# Patient Record
Sex: Male | Born: 1985 | Race: Black or African American | Hispanic: No | Marital: Single | Smoking: Current every day smoker
Health system: Southern US, Community
[De-identification: ages and names within clinical notes are randomized; demographics above are authoritative.]

---
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
## 2008-01-13 ENCOUNTER — Emergency Department (HOSPITAL_COMMUNITY): Admission: EM | Admit: 2008-01-13 | Discharge: 2008-01-13 | Payer: Self-pay | Admitting: Family Medicine

## 2011-02-11 LAB — CULTURE, ROUTINE-ABSCESS

## 2017-07-31 ENCOUNTER — Emergency Department (HOSPITAL_COMMUNITY)
Admission: EM | Admit: 2017-07-31 | Discharge: 2017-08-01 | Disposition: A | Discharge status: Home / Self Care | Payer: Self-pay | Attending: Emergency Medicine | Admitting: Emergency Medicine

## 2017-07-31 ENCOUNTER — Other Ambulatory Visit: Payer: Self-pay

## 2017-07-31 ENCOUNTER — Encounter (HOSPITAL_COMMUNITY): Payer: Self-pay | Admitting: Emergency Medicine

## 2017-07-31 DIAGNOSIS — J069 Acute upper respiratory infection, unspecified: Secondary | ICD-10-CM | POA: Insufficient documentation

## 2017-07-31 DIAGNOSIS — F17210 Nicotine dependence, cigarettes, uncomplicated: Secondary | ICD-10-CM | POA: Insufficient documentation

## 2017-07-31 NOTE — ED Triage Notes (Signed)
Pt c/o generalized body ache 8/10, chills and fatigue started today.

## 2017-08-01 LAB — URINALYSIS, ROUTINE W REFLEX MICROSCOPIC
Bilirubin Urine: NEGATIVE
Glucose, UA: NEGATIVE mg/dL
Hgb urine dipstick: NEGATIVE
KETONES UR: NEGATIVE mg/dL
LEUKOCYTES UA: NEGATIVE
NITRITE: NEGATIVE
PROTEIN: NEGATIVE mg/dL
Specific Gravity, Urine: 1.021 (ref 1.005–1.030)
pH: 6 (ref 5.0–8.0)

## 2017-08-01 MED ORDER — FLUTICASONE PROPIONATE 50 MCG/ACT NA SUSP: 1.0000 | Freq: Every day | NASAL | 0 refills | Status: AC

## 2017-08-01 MED ORDER — PROMETHAZINE-DM 6.25-15 MG/5ML PO SYRP: 5.0000 mL | ORAL_SOLUTION | Freq: Four times a day (QID) | ORAL | 0 refills | Status: AC | PRN

## 2017-08-01 NOTE — ED Provider Notes (Signed)
[name] EMERGENCY DEPARTMENT Provider Note   CSN: [id number] Arrival date & time: 07/31/17  2244     History   Chief Complaint Chief Complaint  Patient presents with  . URI    HPI Cory Mason is a 32 y.o. male presenting for evaluation of cough, nasal congestion, and body aches.  Patient states he has had 1 day history of URI symptoms.  His significant other and daughter have similar symptoms that began in the past several days.  He reports generalized body aches and nasal congestion.  He has a nonproductive cough.  He reports chills without fever.  He is taking Mucinex and Tylenol without significant improvement of his symptoms.  He denies ear pain, eye pain, sore throat, chest pain, shortness of breath, nausea, vomiting, abdominal pain, urinary symptoms, normal bowel movements.  He has tolerated p.o. without difficulty today.  He states he had the flu earlier this year, and this feels similar.  He has no history of COPD or asthma.  He smokes about 4 cigarettes a day.  No other medical problems, does not take medications daily.  HPI  History reviewed. No pertinent past medical history.  There are no active problems to display for this patient.   History reviewed. No pertinent surgical history.      Home Medications    Prior to Admission medications   Medication Sig Start Date End Date Taking? Authorizing Provider  fluticasone (FLONASE) 50 MCG/ACT nasal spray Place 1 spray into both nostrils daily. 08/01/17   Elysa Womac, PA-C  promethazine-dextromethorphan (PROMETHAZINE-DM) 6.25-15 MG/5ML syrup Take 5 mLs by mouth 4 (four) times daily as needed. 08/01/17   Marishka Rentfrow, PA-C    Family History History reviewed. No pertinent family history.  Social History Social History   Tobacco Use  . Smoking status: Current Every Day Smoker    Packs/day: 0.50    Types: Cigarettes  . Smokeless tobacco: Never Used  Substance Use Topics  . Alcohol  use: Not Currently  . Drug use: Yes    Types: Marijuana     Allergies   Patient has no known allergies.   Review of Systems Review of Systems  Constitutional: Positive for chills.  HENT: Positive for congestion. Negative for sore throat.   Respiratory: Positive for cough. Negative for shortness of breath.   Cardiovascular: Negative for chest pain.     Physical Exam Updated Vital Signs BP 119/76   Pulse 90   Temp 98.4 F (36.9 C) (Oral)   Resp 16   Ht 6\' 2"  (1.88 m)   Wt 74.8 kg (165 lb)   SpO2 99%   BMI 21.18 kg/m   Physical Exam  Constitutional: He is oriented to person, place, and time. He appears well-developed and well-nourished. No distress.  HENT:  Head: Normocephalic and atraumatic.  Right Ear: Tympanic membrane, external ear and ear canal normal.  Left Ear: Tympanic membrane, external ear and ear canal normal.  Nose: Mucosal edema present. Right sinus exhibits no maxillary sinus tenderness and no frontal sinus tenderness. Left sinus exhibits no maxillary sinus tenderness and no frontal sinus tenderness.  Mouth/Throat: Uvula is midline, oropharynx is clear and moist and mucous membranes are normal. No tonsillar exudate.  Nasal mucosal edema.  Tenderness to palpation of maxillary sinuses.  OP clear without tonsillar swelling or exudate.  Uvula midline medical palate rise.  TMs not erythematous not bulging bilaterally.  Eyes: Pupils are equal, round, and reactive to light. Conjunctivae and EOM are  normal.  Neck: Normal range of motion.  Cardiovascular: Normal rate, regular rhythm and intact distal pulses.  Pulmonary/Chest: Effort normal and breath sounds normal. He has no decreased breath sounds. He has no wheezes. He has no rhonchi. He has no rales.  Pt speaking in full sentences without difficulty.  Clear lung sounds in all fields  Abdominal: Soft. He exhibits no distension. There is no tenderness.  Musculoskeletal: Normal range of motion.  Lymphadenopathy:     He has cervical adenopathy.  Neurological: He is alert and oriented to person, place, and time.  Skin: Skin is warm.  Psychiatric: He has a normal mood and affect.  Nursing note and vitals reviewed.    ED Treatments / Results  Labs (all labs ordered are listed, but only abnormal results are displayed) Labs Reviewed  URINALYSIS, ROUTINE W REFLEX MICROSCOPIC    EKG None  Radiology No results found.  Procedures Procedures (including critical care time)  Medications Ordered in ED Medications - No data to display   Initial Impression / Assessment and Plan / ED Course  I have reviewed the triage vital signs and the nursing notes.  Pertinent labs & imaging results that were available during my care of the patient were reviewed by me and considered in my medical decision making (see chart for details).     Patient presenting with 1 day h/o URI symptoms.  Physical exam reassuring, patient is afebrile and appears nontoxic.  Pulmonary exam reassuring.  Doubt pneumonia, strep, other bacterial infection, or peritonsillar abscess. Likely viral URI.  Will treat symptomatically.  Patient to follow-up  as needed.  At this time, patient appears safe for discharge.  Return precautions given.  Patient states he understands and agrees to plan.   Final Clinical Impressions(s) / ED Diagnoses   Final diagnoses:  Upper respiratory tract infection, unspecified type    ED Discharge Orders        Ordered    fluticasone (FLONASE) 50 MCG/ACT nasal spray  Daily     08/01/17 0006    promethazine-dextromethorphan (PROMETHAZINE-DM) 6.25-15 MG/5ML syrup  4 times daily PRN     08/01/17 0006       Alveria ApleyCaccavale, Woodrow Drab, PA-C 08/01/17 0020    Gilda CreasePollina, Christopher J, MD 08/01/17 [phone]

## 2017-08-01 NOTE — Discharge Instructions (Addendum)
You likely have a viral illness.  This should be treated symptomatically. °Use Tylenol or ibuprofen as needed for fevers or body aches. °Use Flonase daily for nasal congestion and cough. °Use cough syrup as needed.  °Make sure you stay well-hydrated with water. °Wash your hands frequently to prevent spread of infection. °Return to the emergency room if you develop chest pain, difficulty breathing, or any new or worsening symptoms. ° °

## 2018-01-18 ENCOUNTER — Encounter (HOSPITAL_COMMUNITY): Payer: Self-pay

## 2018-01-18 ENCOUNTER — Emergency Department (HOSPITAL_COMMUNITY)
Admission: EM | Admit: 2018-01-18 | Discharge: 2018-01-18 | Disposition: A | Discharge status: Home / Self Care | Payer: Self-pay | Attending: Emergency Medicine | Admitting: Emergency Medicine

## 2018-01-18 ENCOUNTER — Emergency Department (HOSPITAL_COMMUNITY): Payer: Self-pay

## 2018-01-18 ENCOUNTER — Other Ambulatory Visit: Payer: Self-pay

## 2018-01-18 DIAGNOSIS — S46811A Strain of other muscles, fascia and tendons at shoulder and upper arm level, right arm, initial encounter: Secondary | ICD-10-CM

## 2018-01-18 DIAGNOSIS — F17210 Nicotine dependence, cigarettes, uncomplicated: Secondary | ICD-10-CM | POA: Insufficient documentation

## 2018-01-18 DIAGNOSIS — Z79899 Other long term (current) drug therapy: Secondary | ICD-10-CM | POA: Insufficient documentation

## 2018-01-18 DIAGNOSIS — Y999 Unspecified external cause status: Secondary | ICD-10-CM | POA: Insufficient documentation

## 2018-01-18 DIAGNOSIS — S46812A Strain of other muscles, fascia and tendons at shoulder and upper arm level, left arm, initial encounter: Secondary | ICD-10-CM | POA: Insufficient documentation

## 2018-01-18 DIAGNOSIS — Y939 Activity, unspecified: Secondary | ICD-10-CM | POA: Insufficient documentation

## 2018-01-18 DIAGNOSIS — Y929 Unspecified place or not applicable: Secondary | ICD-10-CM | POA: Insufficient documentation

## 2018-01-18 DIAGNOSIS — X500XXA Overexertion from strenuous movement or load, initial encounter: Secondary | ICD-10-CM | POA: Insufficient documentation

## 2018-01-18 MED ORDER — METHOCARBAMOL 500 MG PO TABS: 500.0000 mg | ORAL_TABLET | Freq: Every evening | ORAL | 0 refills | Status: AC | PRN

## 2018-01-18 NOTE — ED Notes (Signed)
Patient verbalizes understanding of discharge instructions. Opportunity for questioning and answers were provided. Armband removed by staff, pt discharged from ED ambulatory.   

## 2018-01-18 NOTE — ED Provider Notes (Signed)
[name] EMERGENCY DEPARTMENT Provider Note   CSN: [id number] Arrival date & time: 01/18/18  1344     History   Chief Complaint No chief complaint on file.   HPI Cory Mason is a 32 y.o. male presenting for evaluation of R shoulder pain.   Patient states for the past 2 weeks, he has been having right shoulder pain.  Pain is the posterior aspect of his shoulder.  He denies fall, trauma, or injury.  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  Pain is worse with lying flat movement of his right arm.  Nothing makes it better.  Pain is constantly there, although waxes and wanes.  He took 2 naproxen pills last night, has not tried anything else for pain.  There is no significant change in his pain.  He denies pain elsewhere, including on the left side of her low back.  He has no medical problems, takes no medications daily.  He denies numbness or tingling of his arm or hand.  He is not dropping things with his hand.  HPI  History reviewed. No pertinent past medical history.  There are no active problems to display for this patient.   History reviewed. No pertinent surgical history.      Home Medications    Prior to Admission medications   Medication Sig Start Date End Date Taking? Authorizing Provider  fluticasone (FLONASE) 50 MCG/ACT nasal spray Place 1 spray into both nostrils daily. 08/01/17   Davey Bergsma, PA-C  methocarbamol (ROBAXIN) 500 MG tablet Take 1 tablet (500 mg total) by mouth at bedtime as needed for muscle spasms. 01/18/18   Fabianna Keats, PA-C  promethazine-dextromethorphan (PROMETHAZINE-DM) 6.25-15 MG/5ML syrup Take 5 mLs by mouth 4 (four) times daily as needed. 08/01/17   Ala Capri, PA-C    Family History History reviewed. No pertinent family history.  Social History Social History   Tobacco Use  . Smoking status: Current Every Day Smoker    Packs/day: 0.50    Types: Cigarettes  .  Smokeless tobacco: Never Used  Substance Use Topics  . Alcohol use: Not Currently  . Drug use: Yes    Types: Marijuana     Allergies   Patient has no known allergies.   Review of Systems Review of Systems  Musculoskeletal: Positive for myalgias.  Neurological: Negative for numbness.     Physical Exam Updated Vital Signs BP 116/83 (BP Location: Right Arm)   Pulse 67   Temp 97.8 F (36.6 C) (Oral)   Resp 18   Ht 6\' 1"  (1.854 m)   Wt 74.8 kg   SpO2 100%   BMI 21.77 kg/m   Physical Exam  Constitutional: He is oriented to person, place, and time. He appears well-developed and well-nourished. No distress.  HENT:  Head: Normocephalic and atraumatic.  Eyes: EOM are normal.  Neck: Normal range of motion.  Pulmonary/Chest: Effort normal.  Abdominal: He exhibits no distension.  Musculoskeletal: Normal range of motion. He exhibits tenderness. He exhibits no edema.  Tenderness palpation of the right trapezius.  No tenderness palpation of the AC joint.  Grip strength intact bilaterally.  Radial pulses intact bilaterally.  Sensation of upper extremities intact bilaterally.  Full active range of motion of the arm with pain.  No tenderness palpation of midline C-spine or back.  Neurological: He is alert and oriented to person, place, and time. No sensory deficit.  Skin: Skin is warm. Capillary refill takes  less than 2 seconds. No rash noted.  Psychiatric: He has a normal mood and affect.  Nursing note and vitals reviewed.    ED Treatments / Results  Labs (all labs ordered are listed, but only abnormal results are displayed) Labs Reviewed - No data to display  EKG None  Radiology Dg Scapula Right  Result Date: 01/18/2018 CLINICAL DATA:  Right scapular pain for 2 weeks, heavy lifting EXAM: RIGHT SCAPULA - 2+ VIEWS COMPARISON:  None. FINDINGS: Two views of the right scapula show no acute abnormality. The right humeral head appears to be in normal position and the right [facility]  joint is normally aligned. The ribs that are visualized on the right are intact. IMPRESSION: Negative. Electronically Signed   By: Dwyane Dee M.D.   On: 01/18/2018 14:51    Procedures Procedures (including critical care time)  Medications Ordered in ED Medications - No data to display   Initial Impression / Assessment and Plan / ED Course  I have reviewed the triage vital signs and the nursing notes.  Pertinent labs & imaging results that were available during my care of the patient were reviewed by me and considered in my medical decision making (see chart for details).     Presenting for evaluation of right shoulder pain.  Physical exam reassuring, he is neurovascular intact.  Pain is reproducible with palpation of the musculature.  No pain over bony prominences.  X-ray viewed interpreted by me, no fractures or dislocations.  Pain began after patient started working in a job in which he is lifting heavy things.  Likely muscle strain.  Doubt intrathoracic etiology such as pneumonia, patient without fever or cough.  Doubt concerning or emergent back/spine pathology, as no tenderness palpation of midline spine.  Discussed with patient.  Discussed treatment with NSAIDs, muscle relaxers, muscle creams, ice/heat, and stretching.  Discussed typical course of muscle stiffness.  Patient to follow-up with urgent care orthopedics as needed.  At this time, patient appears safe for discharge.  Return precautions given.  Patient states he understands and agrees plan.   Final Clinical Impressions(s) / ED Diagnoses   Final diagnoses:  Strain of right trapezius muscle, initial encounter    ED Discharge Orders         Ordered    methocarbamol (ROBAXIN) 500 MG tablet  At bedtime PRN     01/18/18 1649           Serenitee Fuertes, PA-C 01/18/18 1657    Sabas Sous, MD 01/19/18 0021

## 2018-01-18 NOTE — Discharge Instructions (Signed)
Take naproxen 2 times a day with meals. Take 2 pills at a time. Do not take other anti-inflammatories at the same time (Advil, Motrin, ibuprofen, Aleve). You may supplement with Tylenol if you need further pain control. Use Robaxin as needed for muscle stiffness or soreness. Have caution, as this may make you tired or groggy. Do not drive or operate heavy machinery while taking this medication.  Use muscle creams (bengay, icy hot, salonpas) as needed for pain.  Use heat or ice to help with pain and swelling.  Try to massage and stretch your shoulder. Follow up with your urgent care or orthopedics if pain is not improving with this treatment in 2 weeks.  Return to the ER if you develop high fevers, numbness, you are dropping things with your hand, or any new or concerning symptoms.

## 2018-01-18 NOTE — ED Triage Notes (Signed)
Pt presents with 2 week h/o pain to R scapula and shoulder.  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  Pt reports pain worsens when he moves his arms or lying supine.

## 2019-11-10 IMAGING — CR DG SCAPULA*R*
2 series · 2 of 2 positions shown · non-contrast
Comparison: None.

CLINICAL DATA: Right scapular pain for 2 weeks, heavy lifting

EXAM:
RIGHT SCAPULA - 2+ VIEWS

[scapula ap]
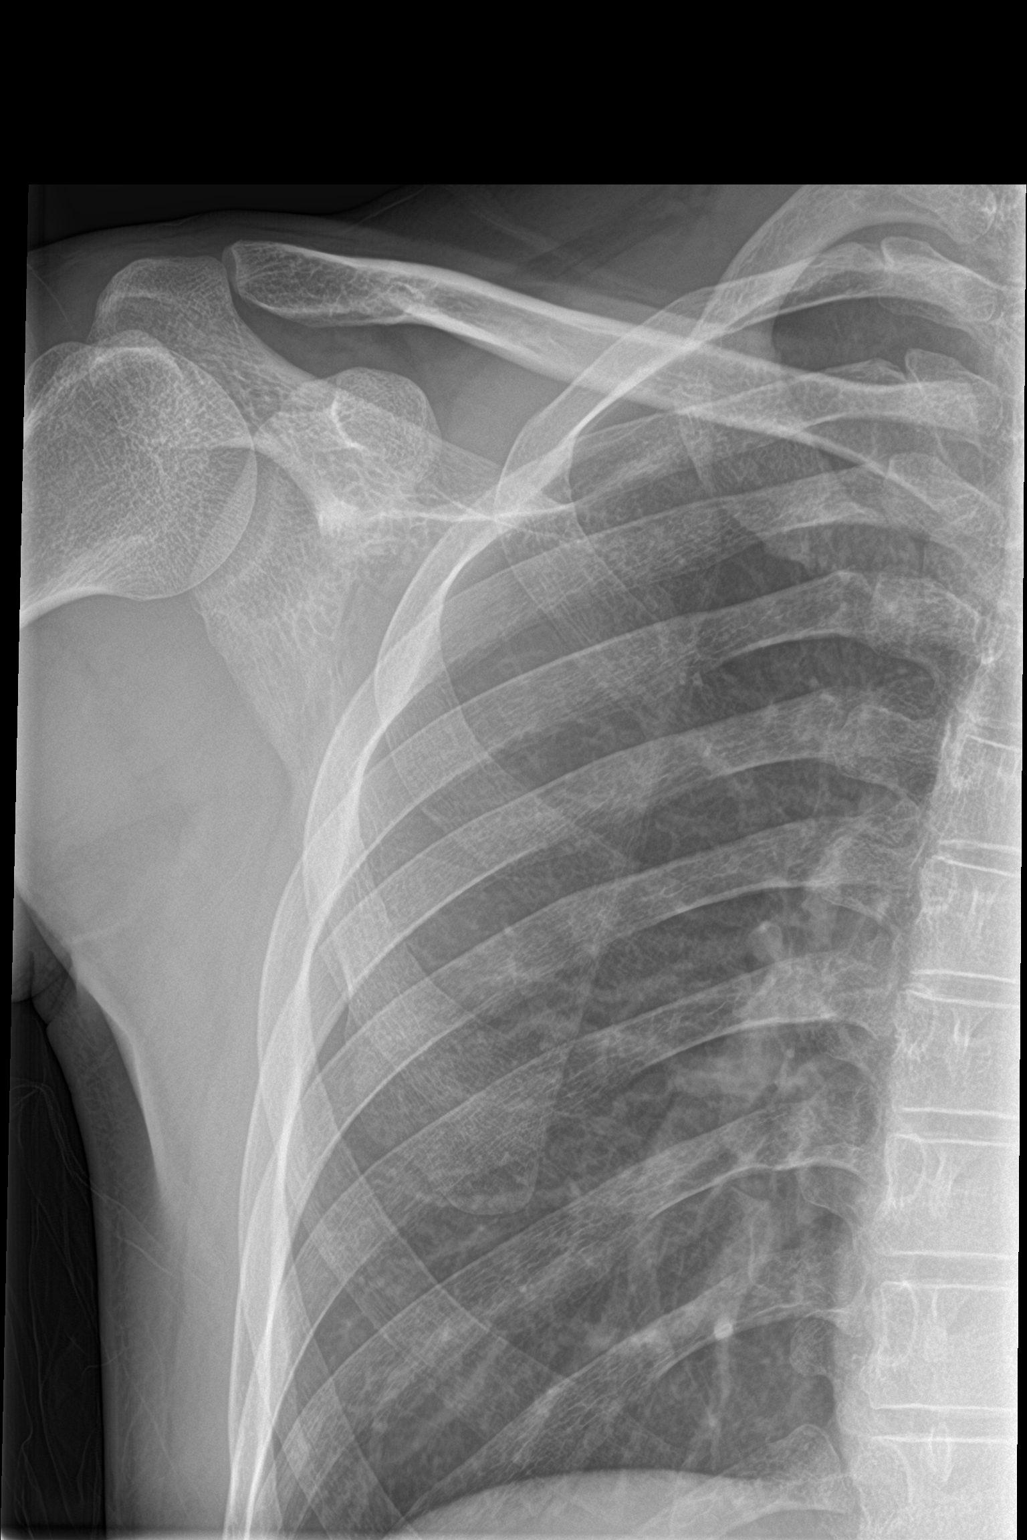

[scapula lat]
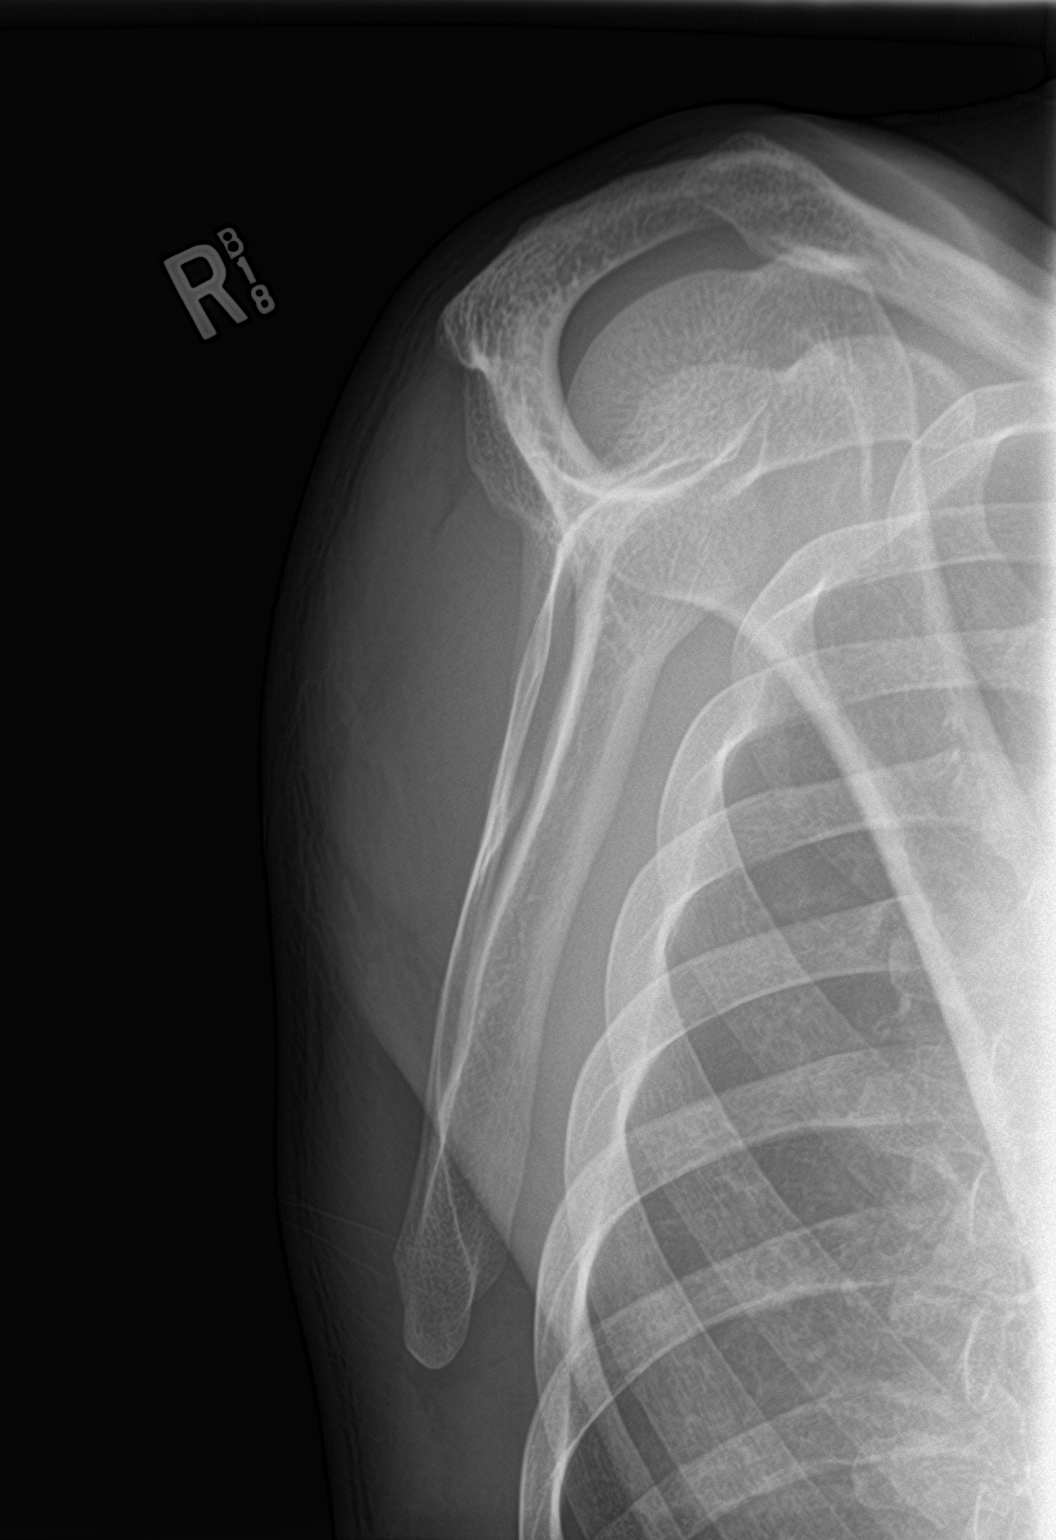

[2 of 2 positions shown; findings below may reference images not displayed]

FINDINGS: Two views of the right scapula show no acute abnormality. The right
humeral head appears to be in normal position and the right AC joint
is normally aligned. The ribs that are visualized on the right are
intact.
IMPRESSION: Negative.
